# Patient Record
Sex: Male | Born: 1975 | Race: White | Hispanic: No | Marital: Single | Smoking: Current every day smoker
Health system: Southern US, Community
[De-identification: ages and names within clinical notes are randomized; demographics above are authoritative.]

## PROBLEM LIST (undated history)

## (undated) HISTORY — PX: CARPAL TUNNEL RELEASE: SHX101

## (undated) HISTORY — PX: HERNIA REPAIR: SHX51

## (undated) HISTORY — PX: TONSILLECTOMY: SUR1361

## (undated) HISTORY — PX: BACK SURGERY: SHX140

---

## 2006-07-17 ENCOUNTER — Ambulatory Visit (HOSPITAL_COMMUNITY): Admission: RE | Admit: 2006-07-17 | Discharge: 2006-07-17 | Payer: Self-pay | Admitting: Sports Medicine

## 2007-06-30 ENCOUNTER — Encounter: Admission: RE | Admit: 2007-06-30 | Discharge: 2007-06-30 | Payer: Self-pay | Admitting: Orthopedic Surgery

## 2007-10-12 ENCOUNTER — Encounter (INDEPENDENT_AMBULATORY_CARE_PROVIDER_SITE_OTHER): Payer: Self-pay | Admitting: Orthopedic Surgery

## 2007-10-12 ENCOUNTER — Inpatient Hospital Stay (HOSPITAL_COMMUNITY): Admission: RE | Admit: 2007-10-12 | Discharge: 2007-10-13 | Payer: Self-pay | Admitting: Orthopedic Surgery

## 2008-07-05 IMAGING — CR DG CHEST 1V PORT
1 series · 1 of 1 positions shown · non-contrast
Comparison: 10/09/06.

CLINICAL DATA: Central line placement.
 PORTABLE CHEST ? 1 VIEW:

[view not recorded]
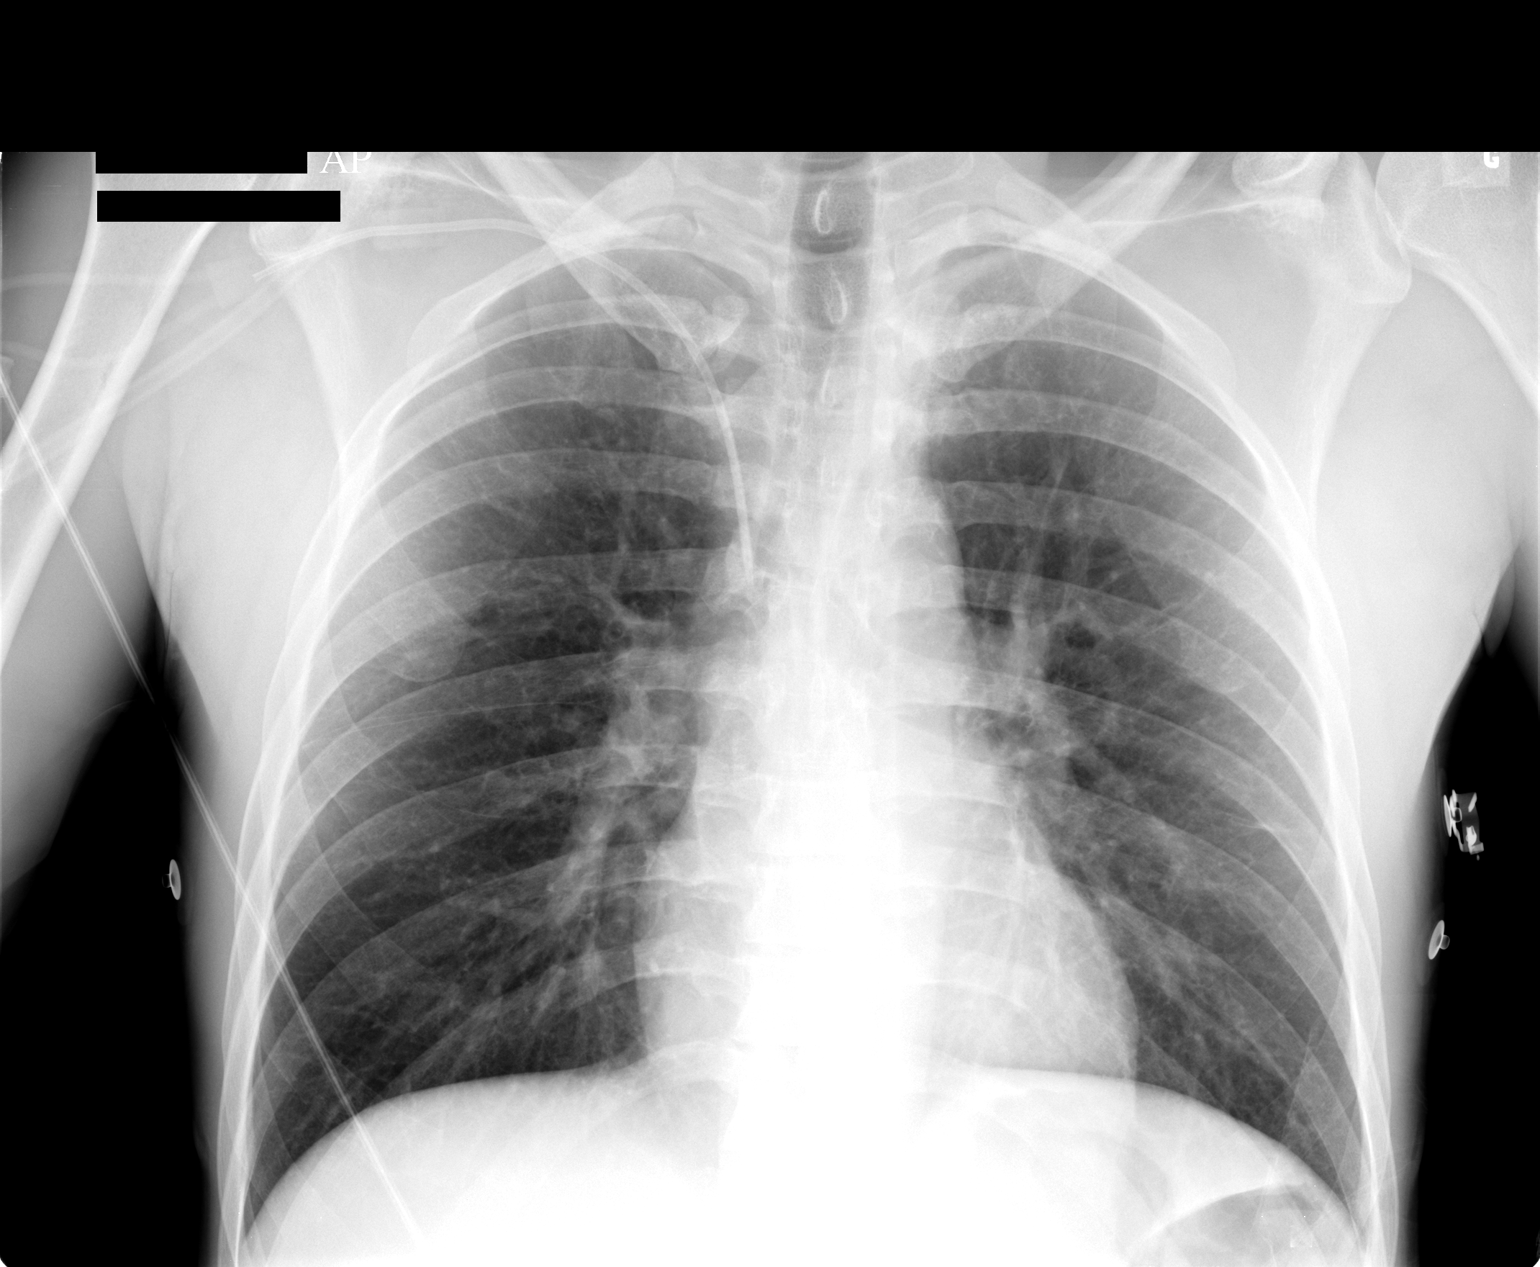

[1 of 1 positions shown; findings below may reference images not displayed]

FINDINGS: The patient has a right subclavian central venous catheter with tip in the superior vena cava.  No pneumothorax.  The lungs are clear.  Heart size is normal.  No effusion.  No focal bony abnormality.
IMPRESSION: Right subclavian catheter without complicating features.  No acute finding.

## 2011-02-03 NOTE — Op Note (Signed)
[name], [name]                 ACCOUNT NO.:  [id number]   MEDICAL RECORD NO.:  [id number]          PATIENT TYPE:  INP   LOCATION:  2550                         FACILITY:  [facility]   PHYSICIAN:  Nelda Severe, MD      DATE OF BIRTH:  01/12/1976   DATE OF PROCEDURE:  10/12/2007  DATE OF DISCHARGE:                               OPERATIVE REPORT   SURGEON:  Nelda Severe, M.D.   ASSISTANT:  Lianne Cure, P.A.-C.   PREOPERATIVE DIAGNOSIS:  Internal disc disruption L5-S1/disc  degeneration.   POSTOPERATIVE DIAGNOSIS:  Internal disc disruption L5-S1/disc  degeneration.   OPERATIVE PROCEDURE:  Anterior interbody fusion L5 to S1 with Synfix  cage/internal fixation plate; autogenous bone graft left anterior iliac  crest.   OPERATIVE NOTE:  The patient was placed under general endotracheal  anesthesia.  A Foley catheter was placed in the bladder.  Sequential  compression devices were placed on both lower extremities.  A pulse  oximeter was placed on the left great toe.  The patient had received  intravenous prophylactic antibiotics.  He was positioned supine on a  radiolucent Jackson table with the arms abducted at the sides.  The hair  was clipped in the suprapubic area.  The abdomen was prepped with  DuraPrep and draped in a square fashion so as to expose the left  anterior iliac crest, as well.  The drapes were secured with Ioban.   A short incision was made just distal to the lateral aspect of the left  anterior iliac crest, about 2-3 inches posterior to the anterior  superior iliac spine.  The periosteum over the iliac crest was injected  with 0.25% Marcaine with epinephrine.  Dissection was carried down onto  the external oblique fascia and the external oblique retracted somewhat  medially.  The fascial insertion was then incised using cutting current  and subperiosteal dissection of the top of the iliac crest performed.  The retractor was placed.  The iliac crest on its  superior surface was  fenestrated with a small gouge to create a hole into the medullary  cavity.  This allowed a curette to be used to scoop out a satisfactory  quantity of cancellous graft.  When enough graft had been obtained, a  small piece Gelfoam was placed into the cavity.   Next, an oblique incision was made in the left lower quadrant in the  left paramedian area at such a level as to be able to approach the L5-S1  disc.  The rectus sheath was cut in line with the incision.  The medial  edge of the rectus was mobilized and retracted laterally.  The arcuate  line was identified.  The fat was bluntly dissected with a sponge on a  stick into the retroperitoneal area.  The peritoneal sac was mobilized  to the right side along with the ureter which was adherent to the  posterior aspect of the peritoneal sac.  The common iliac vein could be  identified as could be the sacral promontory.  2-3 medium veins were  bipolar coagulated after blunt dissection of  the overlying tissue from  right to left.  Bilateral Braugh retractors were placed to retract the  iliac vessels right and left.  We then placed another Braugh blade in  the midline proximally retracting proximally and one distally retracting  distally.  A small screw was placed in the annulus and an AP and lateral  fluoroscopic view taken.  It approximated the midline on the AP view and  was confirmed to be at the L5-S1 level on the lateral.   We then performed a rectangular annulotomy of the L5-S1 disc and used a  special disc elevator to separate disc material from the endplate above  and below.  Peripherally, the disc was detached with curettes and most  the disc removed in one fragment.  I then used curettes to curette  posteriorly back to the outer fibers of the annulus.  Care was taken to  curette away endplate cartilage from the endplates.   The disc was sized with a variety of sizes.  Ultimately, we chose a  small endplate  12 degrees implant.  This was filled with graft and the  graft inserted with the Squid device.  We then checked its position with  AP and lateral fluoroscopic views.  It needed to be countersunk 2-3 mm  which was then done.  Screws were then used to attach it to the  endplates above and below.  Fluoroscopic views, AP and lateral, were  then taken and recorded.   The retractors were then removed sequentially and the ureter checked to  make sure it was intact.  There was no bleeding as the retractors were  removed.  The rectus sheath was closed using continuous #1 Vicryl  suture.  The subcutaneous layer was closed using continuous 2-0 Vicryl  suture and the skin was closed using subcuticular 3-0 undyed Vicryl in  continuous fashion.  The bone graft harvest wound was closed using #1  Vicryl in the fascia, 2-0 Vicryl in the subcutaneous layer, and 3-0  undyed Vicryl in the skin.  Both skin incisions were reinforced with  Dermabond and a gauze dressing applied and secured with OpSite.   The blood loss was estimated at 50 mL.  Subsequent to closure, we waited  for radiographs to be taken to check for the presence of any retained  instruments and none were observed.  The patient returned to the  recovery room awake and was in satisfactory condition.  He has intact  dorsiflexion and plantar flexion of both feet, ankles and toes, and  easily palpable dorsalis pedis pulses bilaterally.      Nelda Severe, MD  Electronically Signed     MT/MEDQ  D:  10/12/2007  T:  10/12/2007  Job:  [id number]

## 2011-02-06 NOTE — Discharge Summary (Signed)
[name], [name]                 ACCOUNT NO.:  [id number]   MEDICAL RECORD NO.:  [id number]          PATIENT TYPE:  INP   LOCATION:  5018                         FACILITY:  [facility]   PHYSICIAN:  Nelda Severe, MD      DATE OF BIRTH:  1976/02/11   DATE OF ADMISSION:  10/12/2007  DATE OF DISCHARGE:  10/13/2007                               DISCHARGE SUMMARY   PREOPERATIVE DIAGNOSIS:  L5-S1 disk central.   PROCEDURE PLANNED:  Anterior interbody fusion with bone graft at L5-S1.   Postoperative minimal blood loss at 50 ml.  Active range of motion.  DP  pulses were intact postoperatively.  He was admitted to [facility] for observation.  Postoperative day one, hemoglobin was stable  at 12.4, platelets 97, white count 10.8, B-MET within normal limits.  He  was afebrile.  Incision clean and dry.  No dressing necessary.  We used  Dermabond.  We discontinued the PCA, the central line.  He was taking  p.o. well, moving independently about the room.  He was discharged home.   DISCHARGE DIAGNOSIS:  L5-S1 disk herniation, status post anterior  interbody fusion with bone graft.   DIET:  Regular.   DISPOSITION:  Stable.   He may shower.  We want no bending, stooping, lifting.  We sent home  with prescription for Norco 10/325, he can take 1-2 every 4 hours as  needed for pain.  Suggest over-the-counter stool softener as needed.  We  placed him on Coumadin 5 mg p.o. daily.  He is going to have PT INR  draws biweekly to be dosed per pharmacy to maintain INR of 2-3.  He is  going to follow up in our office in approximately 4 weeks.  He is to  call for an appointment.      Lianne Cure, P.A.      Nelda Severe, MD  Electronically Signed    MC/MEDQ  D:  10/21/2007  T:  10/21/2007  Job:  [id number]

## 2011-06-11 LAB — COMPREHENSIVE METABOLIC PANEL
ALT: 13
Alkaline Phosphatase: 62
BUN: 8
CO2: 30
Calcium: 9.6
GFR calc non Af Amer: >60
Glucose, Bld: 81
Potassium: 4.6
Sodium: 138
Total Protein: 7.1

## 2011-06-11 LAB — APTT: aPTT: 30

## 2011-06-11 LAB — URINALYSIS, ROUTINE W REFLEX MICROSCOPIC
Bilirubin Urine: NEGATIVE
Hgb urine dipstick: NEGATIVE
Ketones, ur: NEGATIVE
Nitrite: NEGATIVE
Protein, ur: NEGATIVE
Specific Gravity, Urine: 1.011
Urobilinogen, UA: 0.2

## 2011-06-11 LAB — PROTIME-INR
INR: 0.8
Prothrombin Time: 11.7
Prothrombin Time: 12.7

## 2011-06-11 LAB — DIFFERENTIAL
Basophils Relative: 1
Eosinophils Absolute: 0.1
Monocytes Relative: 13 — ABNORMAL HIGH
Neutro Abs: 2.1
Neutrophils Relative %: 53

## 2011-06-11 LAB — CBC
HCT: 36.4 — ABNORMAL LOW
HCT: 42.9
Hemoglobin: 12.4 — ABNORMAL LOW
Hemoglobin: 14.7
MCHC: 34
MCHC: 34.2
MCV: 88.5
RBC: 4.11 — ABNORMAL LOW
RBC: 4.88
RDW: 13.6
WBC: 10.8 — ABNORMAL HIGH

## 2011-06-11 LAB — BASIC METABOLIC PANEL
CO2: 30
Chloride: 105
Creatinine, Ser: 0.98
GFR calc Af Amer: >60
Potassium: 4.1
Sodium: 139

## 2011-06-11 LAB — ABO/RH: ABO/RH(D): B POS

## 2022-05-22 DIAGNOSIS — S2249XA Multiple fractures of ribs, unspecified side, initial encounter for closed fracture: Secondary | ICD-10-CM

## 2022-05-22 HISTORY — DX: Multiple fractures of ribs, unspecified side, initial encounter for closed fracture: S22.49XA

## 2023-03-20 ENCOUNTER — Emergency Department (HOSPITAL_COMMUNITY): Payer: 59

## 2023-03-20 ENCOUNTER — Emergency Department (HOSPITAL_COMMUNITY)
Admission: EM | Admit: 2023-03-20 | Discharge: 2023-03-20 | Disposition: A | Discharge status: Home / Self Care | Payer: 59 | Attending: Emergency Medicine | Admitting: Emergency Medicine

## 2023-03-20 ENCOUNTER — Other Ambulatory Visit: Payer: Self-pay

## 2023-03-20 ENCOUNTER — Encounter (HOSPITAL_COMMUNITY): Payer: Self-pay

## 2023-03-20 DIAGNOSIS — Y9389 Activity, other specified: Secondary | ICD-10-CM | POA: Insufficient documentation

## 2023-03-20 DIAGNOSIS — S199XXA Unspecified injury of neck, initial encounter: Secondary | ICD-10-CM | POA: Diagnosis not present

## 2023-03-20 DIAGNOSIS — R569 Unspecified convulsions: Secondary | ICD-10-CM | POA: Diagnosis not present

## 2023-03-20 DIAGNOSIS — Y92007 Garden or yard of unspecified non-institutional (private) residence as the place of occurrence of the external cause: Secondary | ICD-10-CM | POA: Diagnosis not present

## 2023-03-20 DIAGNOSIS — R0682 Tachypnea, not elsewhere classified: Secondary | ICD-10-CM | POA: Diagnosis not present

## 2023-03-20 DIAGNOSIS — S42021A Displaced fracture of shaft of right clavicle, initial encounter for closed fracture: Secondary | ICD-10-CM | POA: Insufficient documentation

## 2023-03-20 DIAGNOSIS — S4991XA Unspecified injury of right shoulder and upper arm, initial encounter: Secondary | ICD-10-CM | POA: Diagnosis not present

## 2023-03-20 DIAGNOSIS — S42001A Fracture of unspecified part of right clavicle, initial encounter for closed fracture: Secondary | ICD-10-CM | POA: Diagnosis not present

## 2023-03-20 DIAGNOSIS — S0990XA Unspecified injury of head, initial encounter: Secondary | ICD-10-CM | POA: Insufficient documentation

## 2023-03-20 DIAGNOSIS — R55 Syncope and collapse: Secondary | ICD-10-CM | POA: Insufficient documentation

## 2023-03-20 DIAGNOSIS — M7989 Other specified soft tissue disorders: Secondary | ICD-10-CM | POA: Diagnosis not present

## 2023-03-20 DIAGNOSIS — R0689 Other abnormalities of breathing: Secondary | ICD-10-CM | POA: Diagnosis not present

## 2023-03-20 DIAGNOSIS — W19XXXA Unspecified fall, initial encounter: Secondary | ICD-10-CM | POA: Diagnosis not present

## 2023-03-20 DIAGNOSIS — M25519 Pain in unspecified shoulder: Secondary | ICD-10-CM | POA: Diagnosis not present

## 2023-03-20 LAB — CBC WITH DIFFERENTIAL/PLATELET
Abs Immature Granulocytes: 0.04 10*3/uL (ref 0.00–0.07)
Basophils Absolute: 0.1 10*3/uL (ref 0.0–0.1)
Basophils Relative: 1 %
Eosinophils Absolute: 0.1 10*3/uL (ref 0.0–0.5)
Eosinophils Relative: 1 %
HCT: 39.8 % (ref 39.0–52.0)
Hemoglobin: 13.5 g/dL (ref 13.0–17.0)
Immature Granulocytes: 0 %
Lymphocytes Relative: 10 %
Lymphs Abs: 1.4 10*3/uL (ref 0.7–4.0)
MCH: 29.9 pg (ref 26.0–34.0)
MCHC: 33.9 g/dL (ref 30.0–36.0)
MCV: 88.1 fL (ref 80.0–100.0)
Monocytes Absolute: 1.1 10*3/uL — ABNORMAL HIGH (ref 0.1–1.0)
Monocytes Relative: 8 %
Neutro Abs: 11 10*3/uL — ABNORMAL HIGH (ref 1.7–7.7)
Neutrophils Relative %: 80 %
Platelets: 114 10*3/uL — ABNORMAL LOW (ref 150–400)
RBC: 4.52 MIL/uL (ref 4.22–5.81)
RDW: 13.1 % (ref 11.5–15.5)
WBC: 13.7 10*3/uL — ABNORMAL HIGH (ref 4.0–10.5)
nRBC: 0 % (ref 0.0–0.2)

## 2023-03-20 LAB — ETHANOL: Alcohol, Ethyl (B): <10 mg/dL (ref <10)

## 2023-03-20 MED ORDER — HYDROMORPHONE HCL 1 MG/ML IJ SOLN
1.0000 mg | Freq: Once | INTRAMUSCULAR | Status: AC
  Administered 2023-03-20: 1 mg via INTRAVENOUS
  Filled 2023-03-20: qty 1

## 2023-03-20 MED ORDER — OXYCODONE-ACETAMINOPHEN 5-325 MG PO TABS
1.0000 | ORAL_TABLET | Freq: Once | ORAL | Status: AC
  Administered 2023-03-20: 1 via ORAL
  Filled 2023-03-20: qty 1

## 2023-03-20 MED ORDER — OXYCODONE-ACETAMINOPHEN 5-325 MG PO TABS: 1.0000 | ORAL_TABLET | Freq: Four times a day (QID) | ORAL | 0 refills | Status: DC | PRN

## 2023-03-20 NOTE — Progress Notes (Signed)
Orthopedic Tech Progress Note Patient Details:  [name] August 28, 1976 [id number]  Ortho Devices Type of Ortho Device: Sling immobilizer Ortho Device/Splint Location: rue Ortho Device/Splint Interventions: Ordered, Application, Adjustment  I applied the sling immobilizer and taught the patient and family how to apply it. Post Interventions Patient Tolerated: Well Instructions Provided: Care of device  [name] 03/20/2023, 11:14 PM
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
## 2023-03-20 NOTE — ED Triage Notes (Signed)
Given total of fentanyl by EMS

## 2023-03-23 ENCOUNTER — Ambulatory Visit (HOSPITAL_BASED_OUTPATIENT_CLINIC_OR_DEPARTMENT_OTHER): Payer: 59 | Admitting: Student

## 2023-03-31 ENCOUNTER — Ambulatory Visit (INDEPENDENT_AMBULATORY_CARE_PROVIDER_SITE_OTHER): Payer: 59

## 2023-03-31 ENCOUNTER — Ambulatory Visit (INDEPENDENT_AMBULATORY_CARE_PROVIDER_SITE_OTHER): Payer: 59 | Admitting: Student

## 2023-03-31 ENCOUNTER — Encounter (HOSPITAL_BASED_OUTPATIENT_CLINIC_OR_DEPARTMENT_OTHER): Payer: Self-pay | Admitting: Student

## 2023-03-31 DIAGNOSIS — S42021A Displaced fracture of shaft of right clavicle, initial encounter for closed fracture: Secondary | ICD-10-CM
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
## 2023-04-05 ENCOUNTER — Ambulatory Visit (HOSPITAL_BASED_OUTPATIENT_CLINIC_OR_DEPARTMENT_OTHER): Payer: Self-pay | Admitting: Orthopaedic Surgery

## 2023-04-05 DIAGNOSIS — S42021A Displaced fracture of shaft of right clavicle, initial encounter for closed fracture: Secondary | ICD-10-CM

## 2023-04-05 NOTE — Progress Notes (Signed)
[name] did not answer my calls or call me back.  I called and left instructions.  Nothing to eat after midnight, May have H2O , soda, Gatorade, tea,until 0700.  I instructed patient to not take any medications.  I gave shower instructions. I left the Pre- Op desk number and asked him to call for any ?'s, problems. If running late, if he or anyone in the home has s/s of Covid, if he has been around anyone with Covid in the past 10 days or if he develops s/s of respiratory infection.

## 2023-04-06 ENCOUNTER — Other Ambulatory Visit: Payer: Self-pay

## 2023-04-06 ENCOUNTER — Ambulatory Visit (HOSPITAL_BASED_OUTPATIENT_CLINIC_OR_DEPARTMENT_OTHER): Payer: 59 | Admitting: Anesthesiology

## 2023-04-06 ENCOUNTER — Ambulatory Visit (HOSPITAL_COMMUNITY)
Admission: RE | Admit: 2023-04-06 | Discharge: 2023-04-06 | Disposition: A | Discharge status: Home / Self Care | Payer: 59 | Attending: Orthopaedic Surgery | Admitting: Orthopaedic Surgery

## 2023-04-06 ENCOUNTER — Encounter (HOSPITAL_COMMUNITY)
Admission: RE | Disposition: A | Discharge status: Home / Self Care | Payer: Self-pay | Source: Home / Self Care | Attending: Orthopaedic Surgery

## 2023-04-06 ENCOUNTER — Ambulatory Visit (HOSPITAL_COMMUNITY): Payer: 59 | Admitting: Anesthesiology

## 2023-04-06 ENCOUNTER — Ambulatory Visit (HOSPITAL_COMMUNITY): Payer: 59

## 2023-04-06 ENCOUNTER — Encounter (HOSPITAL_COMMUNITY): Payer: Self-pay | Admitting: Orthopaedic Surgery

## 2023-04-06 DIAGNOSIS — F17210 Nicotine dependence, cigarettes, uncomplicated: Secondary | ICD-10-CM | POA: Diagnosis not present

## 2023-04-06 DIAGNOSIS — S42021A Displaced fracture of shaft of right clavicle, initial encounter for closed fracture: Secondary | ICD-10-CM

## 2023-04-06 DIAGNOSIS — Y9355 Activity, bike riding: Secondary | ICD-10-CM | POA: Insufficient documentation

## 2023-04-06 DIAGNOSIS — S42021D Displaced fracture of shaft of right clavicle, subsequent encounter for fracture with routine healing: Secondary | ICD-10-CM | POA: Diagnosis not present

## 2023-04-06 HISTORY — PX: ORIF CLAVICULAR FRACTURE: SHX5055

## 2023-04-06 SURGERY — OPEN REDUCTION INTERNAL FIXATION (ORIF) CLAVICULAR FRACTURE
Anesthesia: General | Laterality: Right

## 2023-04-06 MED ORDER — FENTANYL CITRATE (PF) 250 MCG/5ML IJ SOLN
INTRAMUSCULAR | Status: DC | PRN
  Administered 2023-04-06: 150 ug via INTRAVENOUS

## 2023-04-06 MED ORDER — DEXAMETHASONE SODIUM PHOSPHATE 10 MG/ML IJ SOLN
INTRAMUSCULAR | Status: DC | PRN
  Administered 2023-04-06: 10 mg via INTRAVENOUS

## 2023-04-06 MED ORDER — VANCOMYCIN HCL 1000 MG IV SOLR
INTRAVENOUS | Status: AC
  Filled 2023-04-06: qty 20

## 2023-04-06 MED ORDER — CEFAZOLIN SODIUM-DEXTROSE 2-4 GM/100ML-% IV SOLN
2.0000 g | INTRAVENOUS | Status: AC
  Administered 2023-04-06: 2 g via INTRAVENOUS

## 2023-04-06 MED ORDER — ORAL CARE MOUTH RINSE: 15.0000 mL | Freq: Once | OROMUCOSAL | Status: AC

## 2023-04-06 MED ORDER — IBUPROFEN 800 MG PO TABS: 800.0000 mg | ORAL_TABLET | Freq: Three times a day (TID) | ORAL | 0 refills | Status: AC

## 2023-04-06 MED ORDER — ASPIRIN 325 MG PO TBEC: 325.0000 mg | DELAYED_RELEASE_TABLET | Freq: Every day | ORAL | 0 refills | Status: AC

## 2023-04-06 MED ORDER — CEFAZOLIN SODIUM-DEXTROSE 2-4 GM/100ML-% IV SOLN
INTRAVENOUS | Status: AC
  Filled 2023-04-06: qty 100

## 2023-04-06 MED ORDER — AMISULPRIDE (ANTIEMETIC) 5 MG/2ML IV SOLN: 10.0000 mg | Freq: Once | INTRAVENOUS | Status: DC | PRN

## 2023-04-06 MED ORDER — TRANEXAMIC ACID-NACL 1000-0.7 MG/100ML-% IV SOLN
1000.0000 mg | INTRAVENOUS | Status: AC
  Administered 2023-04-06: 1000 mg via INTRAVENOUS

## 2023-04-06 MED ORDER — LACTATED RINGERS IV SOLN: INTRAVENOUS | Status: DC

## 2023-04-06 MED ORDER — ACETAMINOPHEN 500 MG PO TABS: 500.0000 mg | ORAL_TABLET | Freq: Three times a day (TID) | ORAL | 0 refills | Status: AC

## 2023-04-06 MED ORDER — CHLORHEXIDINE GLUCONATE 0.12 % MT SOLN
OROMUCOSAL | Status: AC
  Administered 2023-04-06: 15 mL via OROMUCOSAL
  Filled 2023-04-06: qty 15

## 2023-04-06 MED ORDER — PROPOFOL 10 MG/ML IV BOLUS
INTRAVENOUS | Status: DC | PRN
  Administered 2023-04-06: 150 mg via INTRAVENOUS

## 2023-04-06 MED ORDER — TRANEXAMIC ACID-NACL 1000-0.7 MG/100ML-% IV SOLN
INTRAVENOUS | Status: AC
  Filled 2023-04-06: qty 100

## 2023-04-06 MED ORDER — BUPIVACAINE-EPINEPHRINE 0.5% -1:200000 IJ SOLN
INTRAMUSCULAR | Status: DC | PRN
  Administered 2023-04-06: 20 mL

## 2023-04-06 MED ORDER — SUGAMMADEX SODIUM 200 MG/2ML IV SOLN
INTRAVENOUS | Status: DC | PRN
  Administered 2023-04-06: 131.6 mg via INTRAVENOUS

## 2023-04-06 MED ORDER — CHLORHEXIDINE GLUCONATE 0.12 % MT SOLN: 15.0000 mL | Freq: Once | OROMUCOSAL | Status: AC

## 2023-04-06 MED ORDER — FENTANYL CITRATE (PF) 250 MCG/5ML IJ SOLN
INTRAMUSCULAR | Status: AC
  Filled 2023-04-06: qty 5

## 2023-04-06 MED ORDER — PHENYLEPHRINE HCL-NACL 20-0.9 MG/250ML-% IV SOLN
INTRAVENOUS | Status: DC | PRN
  Administered 2023-04-06: 25 ug/min via INTRAVENOUS

## 2023-04-06 MED ORDER — LIDOCAINE 2% (20 MG/ML) 5 ML SYRINGE
INTRAMUSCULAR | Status: DC | PRN
  Administered 2023-04-06: 60 mg via INTRAVENOUS

## 2023-04-06 MED ORDER — MEPERIDINE HCL 25 MG/ML IJ SOLN: 6.2500 mg | INTRAMUSCULAR | Status: DC | PRN

## 2023-04-06 MED ORDER — PROMETHAZINE HCL 25 MG/ML IJ SOLN: 6.2500 mg | INTRAMUSCULAR | Status: DC | PRN

## 2023-04-06 MED ORDER — OXYCODONE HCL 5 MG PO TABS: 5.0000 mg | ORAL_TABLET | Freq: Once | ORAL | Status: AC | PRN

## 2023-04-06 MED ORDER — HYDROMORPHONE HCL 1 MG/ML IJ SOLN
INTRAMUSCULAR | Status: AC
  Filled 2023-04-06: qty 1

## 2023-04-06 MED ORDER — VANCOMYCIN HCL 1000 MG IV SOLR
INTRAVENOUS | Status: DC | PRN
  Administered 2023-04-06: 1000 mg via TOPICAL

## 2023-04-06 MED ORDER — OXYCODONE HCL 5 MG/5ML PO SOLN
ORAL | Status: AC
  Filled 2023-04-06: qty 5

## 2023-04-06 MED ORDER — PROPOFOL 500 MG/50ML IV EMUL
INTRAVENOUS | Status: DC | PRN
  Administered 2023-04-06: 50 ug/kg/min via INTRAVENOUS

## 2023-04-06 MED ORDER — MIDAZOLAM HCL 2 MG/2ML IJ SOLN
INTRAMUSCULAR | Status: DC | PRN
  Administered 2023-04-06: 2 mg via INTRAVENOUS

## 2023-04-06 MED ORDER — GABAPENTIN 300 MG PO CAPS
ORAL_CAPSULE | ORAL | Status: AC
  Administered 2023-04-06: 300 mg via ORAL
  Filled 2023-04-06: qty 1

## 2023-04-06 MED ORDER — 0.9 % SODIUM CHLORIDE (POUR BTL) OPTIME
TOPICAL | Status: DC | PRN
  Administered 2023-04-06: 1000 mL

## 2023-04-06 MED ORDER — GABAPENTIN 300 MG PO CAPS: 300.0000 mg | ORAL_CAPSULE | Freq: Once | ORAL | Status: AC

## 2023-04-06 MED ORDER — BUPIVACAINE-EPINEPHRINE (PF) 0.5% -1:200000 IJ SOLN
INTRAMUSCULAR | Status: AC
  Filled 2023-04-06: qty 30

## 2023-04-06 MED ORDER — MIDAZOLAM HCL 2 MG/2ML IJ SOLN
INTRAMUSCULAR | Status: AC
  Filled 2023-04-06: qty 2

## 2023-04-06 MED ORDER — HYDROMORPHONE HCL 1 MG/ML IJ SOLN
0.2500 mg | INTRAMUSCULAR | Status: DC | PRN
  Administered 2023-04-06 (×2): 0.5 mg via INTRAVENOUS

## 2023-04-06 MED ORDER — OXYCODONE HCL 5 MG PO TABS: 5.0000 mg | ORAL_TABLET | ORAL | 0 refills | Status: AC | PRN

## 2023-04-06 MED ORDER — ONDANSETRON HCL 4 MG/2ML IJ SOLN
INTRAMUSCULAR | Status: DC | PRN
  Administered 2023-04-06: 4 mg via INTRAVENOUS

## 2023-04-06 MED ORDER — ACETAMINOPHEN 500 MG PO TABS
ORAL_TABLET | ORAL | Status: AC
  Administered 2023-04-06: 1000 mg via ORAL
  Filled 2023-04-06: qty 2

## 2023-04-06 MED ORDER — ROCURONIUM BROMIDE 10 MG/ML (PF) SYRINGE
PREFILLED_SYRINGE | INTRAVENOUS | Status: DC | PRN
  Administered 2023-04-06: 70 mg via INTRAVENOUS

## 2023-04-06 MED ORDER — ACETAMINOPHEN 500 MG PO TABS: 1000.0000 mg | ORAL_TABLET | Freq: Once | ORAL | Status: AC

## 2023-04-06 MED ORDER — OXYCODONE HCL 5 MG/5ML PO SOLN
5.0000 mg | Freq: Once | ORAL | Status: AC | PRN
  Administered 2023-04-06: 5 mg via ORAL

## 2023-04-06 SURGICAL SUPPLY — 53 items
BAG COUNTER SPONGE SURGICOUNT (BAG) ×2 IMPLANT
BAG SPNG CNTER NS LX DISP (BAG) ×1
BIT DRILL CLAV ALPS 2.7X145 (BIT) IMPLANT
BIT DRILL QC 110 3.5 (BIT) ×1
BIT DRILL QC 110 3.5MM (BIT) IMPLANT
BNDG COHESIVE 4X5 TAN STRL (GAUZE/BANDAGES/DRESSINGS) IMPLANT
BRUSH SCRUB EZ PLAIN DRY (MISCELLANEOUS) ×4 IMPLANT
COVER SURGICAL LIGHT HANDLE (MISCELLANEOUS) ×4 IMPLANT
DRAPE C-ARM 42X72 X-RAY (DRAPES) ×2 IMPLANT
DRAPE INCISE IOBAN 66X45 STRL (DRAPES) ×2 IMPLANT
DRAPE LAPAROTOMY TRNSV 102X78 (DRAPES) ×2 IMPLANT
DRAPE ORTHO SPLIT 77X108 STRL (DRAPES) ×1
DRAPE SURG ORHT 6 SPLT 77X108 (DRAPES) IMPLANT
DRAPE U-SHAPE 47X51 STRL (DRAPES) ×4 IMPLANT
DRILL BIT QC 110 3.5MM (BIT) ×1
DRSG MEPILEX POST OP 4X8 (GAUZE/BANDAGES/DRESSINGS) ×2 IMPLANT
ELECT REM PT RETURN 9FT ADLT (ELECTROSURGICAL) ×1
ELECTRODE REM PT RTRN 9FT ADLT (ELECTROSURGICAL) ×2 IMPLANT
GLOVE BIO SURGEON STRL SZ7.5 (GLOVE) ×2 IMPLANT
GLOVE BIO SURGEON STRL SZ8 (GLOVE) ×2 IMPLANT
GLOVE BIOGEL PI IND STRL 7.5 (GLOVE) ×2 IMPLANT
GLOVE BIOGEL PI IND STRL 8 (GLOVE) ×2 IMPLANT
GLOVE SURG ORTHO LTX SZ7.5 (GLOVE) ×4 IMPLANT
GOWN STRL REUS W/ TWL LRG LVL3 (GOWN DISPOSABLE) ×4 IMPLANT
GOWN STRL REUS W/ TWL XL LVL3 (GOWN DISPOSABLE) ×2 IMPLANT
GOWN STRL REUS W/TWL LRG LVL3 (GOWN DISPOSABLE) ×2
GOWN STRL REUS W/TWL XL LVL3 (GOWN DISPOSABLE) ×1
K-WIRE TROCHAR TIP ALPS 1.6 (WIRE) ×1
KIT BASIN OR (CUSTOM PROCEDURE TRAY) ×2 IMPLANT
KIT TURNOVER KIT B (KITS) ×2 IMPLANT
KWIRE TROCHAR TIP ALPS 1.6 (WIRE) IMPLANT
MANIFOLD NEPTUNE II (INSTRUMENTS) ×2 IMPLANT
NS IRRIG 1000ML POUR BTL (IV SOLUTION) ×2 IMPLANT
PACK GENERAL/GYN (CUSTOM PROCEDURE TRAY) ×2 IMPLANT
PAD ARMBOARD 7.5X6 YLW CONV (MISCELLANEOUS) ×4 IMPLANT
PLATE CLAV SUP RT 8H 900 NS (Plate) IMPLANT
SCREW CORT LP 3.5X12 (Screw) IMPLANT
SCREW CORT LP T15 3.5X16 (Screw) IMPLANT
SCREW LP NL T15 3.5X20 (Screw) IMPLANT
SCREW T15 LP CORT 3.5X10MM (Screw) IMPLANT
SCREW TIS LP 3.5X18 NS (Screw) IMPLANT
STAPLER VISISTAT 35W (STAPLE) ×2 IMPLANT
STRIP CLOSURE SKIN 1/2X4 (GAUZE/BANDAGES/DRESSINGS) ×2 IMPLANT
SUCTION TUBE FRAZIER 10FR DISP (SUCTIONS) ×2 IMPLANT
SUT MNCRL AB 3-0 PS2 27 (SUTURE) ×2 IMPLANT
SUT VIC AB 0 CT1 27 (SUTURE) ×1
SUT VIC AB 0 CT1 27XBRD ANBCTR (SUTURE) ×2 IMPLANT
SUT VIC AB 2-0 CT1 27 (SUTURE) ×1
SUT VIC AB 2-0 CT1 TAPERPNT 27 (SUTURE) ×2 IMPLANT
SYR CONTROL 10ML LL (SYRINGE) IMPLANT
TOWEL GREEN STERILE (TOWEL DISPOSABLE) ×2 IMPLANT
TOWEL GREEN STERILE FF (TOWEL DISPOSABLE) ×4 IMPLANT
WATER STERILE IRR 1000ML POUR (IV SOLUTION) ×2 IMPLANT

## 2023-04-06 NOTE — Op Note (Signed)
Date of Surgery: 04/06/2023  INDICATIONS: [name] is a 47 y.o.-year-old male with a right displaced clavicle fracture.  He ultimately elected for open reduction internal fixation given his displacement profile.  The risk and benefits of the procedure were discussed in detail and documented in the pre-operative evaluation.   PREOPERATIVE DIAGNOSIS: 1.  Displaced right midshaft clavicle fracture  POSTOPERATIVE DIAGNOSIS: Same.  PROCEDURE: 1.  Open reduction internal fixation right clavicle fracture  SURGEON: James Deeds MD  ASSISTANT: Kerby Less, ATC  ANESTHESIA:  general +10 cc 0.25% Marcaine  IV FLUIDS AND URINE: See anesthesia record.  ANTIBIOTICS: Ancef  ESTIMATED BLOOD LOSS: 20 mL.  IMPLANTS:  Implant Name Type Inv. Item Serial No. Manufacturer Lot No. LRB No. Used Action  PLATE CLAV SUP RT 8H 900 NS - [id number] Plate PLATE CLAV SUP RT 8H 900 NS  [employer](ORTH,TRAU,BIO,SG)  Right 1 Implanted  SCREW T15 LP CORT 3.5X10MM - [id number] Screw SCREW T15 LP CORT 3.5X10MM  [employer](ORTH,TRAU,BIO,SG)  Right 1 Implanted  SCREW CORT LP 3.5X12 - [id number] Screw SCREW CORT LP 3.5X12  [employer](ORTH,TRAU,BIO,SG)  Right 1 Implanted  SCREW CORT LP T15 3.5X16 - [id number] Screw SCREW CORT LP T15 3.5X16  [employer](ORTH,TRAU,BIO,SG)  Right 7 Implanted  SCREW LP NL T15 3.5X20 - [id number] Screw SCREW LP NL T15 3.5X20  [employer]IMMER [employer]ON(ORTH,TRAU,BIO,SG)  Right 2 Implanted  SCREW TIS LP 3.5X18 NS - [id number] Screw SCREW TIS LP 3.5X18 NS  [employer](ORTH,TRAU,BIO,SG)  Right 1 Implanted    DRAINS: None  CULTURES: None  COMPLICATIONS: none  DESCRIPTION OF PROCEDURE:  I identified the patient in the pre-operative holding area.  I marked the operative shoulder with my initials. I reviewed the risks and benefits of the proposed surgical intervention and the patient (and/or patient's guardian) wished to proceed.  Anesthesia was then performed with regional block.  The patient  was transferred to the operative suite and placed in the lazy beach chair position with all bony prominences padded.  The patient was provided general anesthesia.   SCDs were placed on the bilateral lower extremity. Appropriate antibiotics was administered within 1 hour before incision. The operative extremity was then prepped and draped in standard fashion. A time out was performed confirming the correct extremity, correct patient and correct procedure.    A 6cm incision was made over the anterior border of the clavicle and carefully taken with full thickness flaps to the clavicle. The fracture was identified. Hematoma was removed and the bone ends were cleared of debris to facilitate reduction. Two lobster claws were used to reduce the fragments and a pointed reduction clamp used to hold provisional fixation. Appropriate reduction was confirmed with direct visualization and fluoroscopy. A 8 hole Zimmer clavicle plate was placed over the superior border of clavicle. Appropriate size and position was confirmed with fluoroscopy. Non-locking screws were placed medial and lateral of the fracture site. Throughout, care was taken to protect from inadvertent drill with protection by retractors. The butterfly fragments were approximated to their location with suture while leaving their soft tissue attachments in place to facilitate healing with bridging technique. Fluoroscopy and direct visualization confirmed final reduction and hardware position. The wound was copiously irrigated. The wound was closed in layers with 0-vicryl, 2-0 vicryl, and 3-0 nylon.    Instrument, sponge, and needle counts were correct prior to wound closure and at the conclusion of  the case.   The wound was dressed with xeroform, 4x8s, and tegaderm. The arm was placed  into a sling.   The patient awoke from anesthesia without difficulty and was transferred to the PACU in stable     POSTOPERATIVE PLAN: He may begin active range of motion  as tolerated.  He will refrain from lifting any weight heavier than 5 pounds.  He will be placed on 2 weeks of aspirin for DVT prophylaxis  James Deeds, MD 12:08 PM

## 2023-04-06 NOTE — Brief Op Note (Signed)
   Brief Op Note  Date of Surgery: 04/06/2023  Preoperative Diagnosis: RIGHT DISPLACED CLAVICLE FRACTURE  Postoperative Diagnosis: same  Procedure: Procedure(s): RIGHT OPEN REDUCTION INTERNAL FIXATION (ORIF) CLAVICULAR FRACTURE  Implants: Implant Name Type Inv. Item Serial No. Manufacturer Lot No. LRB No. Used Action  PLATE CLAV SUP RT 8H 900 NS - [id number] Plate PLATE CLAV SUP RT 8H 900 NS  [employer](ORTH,TRAU,BIO,SG)  Right 1 Implanted  SCREW T15 LP CORT 3.5X10MM - [id number] Screw SCREW T15 LP CORT 3.5X10MM  [employer](ORTH,TRAU,BIO,SG)  Right 1 Implanted  SCREW CORT LP 3.5X12 - [id number] Screw SCREW CORT LP 3.5X12  [employer](ORTH,TRAU,BIO,SG)  Right 1 Implanted  SCREW CORT LP T15 3.5X16 - [id number] Screw SCREW CORT LP T15 3.5X16  [employer](ORTH,TRAU,BIO,SG)  Right 7 Implanted  SCREW LP NL T15 3.5X20 - [id number] Screw SCREW LP NL T15 3.5X20  [employer](ORTH,TRAU,BIO,SG)  Right 2 Implanted  SCREW TIS LP 3.5X18 NS - [id number] Screw SCREW TIS LP 3.5X18 NS  [employer](ORTH,TRAU,BIO,SG)  Right 1 Implanted    Surgeons: Surgeon(s): Huel Cote, MD  Anesthesia: General    Estimated Blood Loss: See anesthesia record  Complications: None  Condition to PACU: Stable  Benancio Deeds, MD 04/06/2023 12:08 PM

## 2023-04-06 NOTE — Anesthesia Preprocedure Evaluation (Signed)
Anesthesia Evaluation  Patient identified by MRN, date of birth, ID band Patient awake    Reviewed: Allergy & Precautions, H&P , NPO status , Patient's Chart, lab work & pertinent test results  Airway Mallampati: II  TM Distance: >3 FB Neck ROM: Full    Dental no notable dental hx.    Pulmonary neg pulmonary ROS, Current Smoker and Patient abstained from smoking.   Pulmonary exam normal breath sounds clear to auscultation       Cardiovascular negative cardio ROS Normal cardiovascular exam Rhythm:Regular Rate:Normal     Neuro/Psych negative neurological ROS  negative psych ROS   GI/Hepatic negative GI ROS, Neg liver ROS,,,  Endo/Other  negative endocrine ROS    Renal/GU negative Renal ROS  negative genitourinary   Musculoskeletal negative musculoskeletal ROS (+)    Abdominal   Peds negative pediatric ROS (+)  Hematology negative hematology ROS (+)   Anesthesia Other Findings   Reproductive/Obstetrics negative OB ROS                             Anesthesia Physical Anesthesia Plan  ASA: 2  Anesthesia Plan: General   Post-op Pain Management: Dilaudid IV   Induction: Intravenous  PONV Risk Score and Plan: 1 and Ondansetron and Treatment may vary due to age or medical condition  Airway Management Planned: Oral ETT  Additional Equipment:   Intra-op Plan:   Post-operative Plan: Extubation in OR  Informed Consent: I have reviewed the patients History and Physical, chart, labs and discussed the procedure including the risks, benefits and alternatives for the proposed anesthesia with the patient or authorized representative who has indicated his/her understanding and acceptance.     Dental advisory given  Plan Discussed with: CRNA  Anesthesia Plan Comments:        Anesthesia Quick Evaluation

## 2023-04-06 NOTE — Discharge Instructions (Signed)
     Discharge Instructions    Attending Surgeon: Huel Cote, MD Office Phone Number: [phone]   Diagnosis and Procedures:    Surgeries Performed: Right clavicle open reduction internal fixation  Discharge Plan:    Diet: Resume usual diet. Begin with light or bland foods.  Drink plenty of fluids.  Activity:  Keep sling and dressing in place until your follow up visit in Physical Therapy You are advised to go home directly from the hospital or surgical center. Restrict your activities.  GENERAL INSTRUCTIONS: 1.  Keep your surgical site elevated above your heart for at least 5-7 days or longer to prevent swelling. This will improve your comfort and your overall recovery following surgery.     2. Please call Dr. Serena Croissant office at [phone] with questions Monday-Friday during business hours. If no one answers, please leave a message and someone should get back to the patient within 24 hours. For emergencies please call 911 or proceed to the emergency room.   3. Patient to notify surgical team if experiences any of the following: Bowel/Bladder dysfunction, uncontrolled pain, nerve/muscle weakness, incision with increased drainage or redness, nausea/vomiting and Fever greater than 101.0 F.  Be alert for signs of infection including redness, streaking, odor, fever or chills. Be alert for excessive pain or bleeding and notify your surgeon immediately.  WOUND INSTRUCTIONS:   Leave your dressing/cast/splint in place until your post operative visit.  Keep it clean and dry.  Always keep the incision clean and dry until the staples/sutures are removed. If there is no drainage from the incision you should keep it open to air. If there is drainage from the incision you must keep it covered at all times until the drainage stops  Do not soak in a bath tub, hot tub, pool, lake or other body of water until 21 days after your surgery and your incision is completely dry and healed.  If  you have removable sutures (or staples) they must be removed 10-14 days (unless otherwise instructed) from the day of your surgery.     1)  Elevate the extremity as much as possible.  2)  Keep the dressing clean and dry.  3)  Please call us if the dressing becomes wet or dirty.  4)  If you are experiencing worsening pain or worsening swelling, please call.     MEDICATIONS: Resume all previous home medications at the previous prescribed dose and frequency unless otherwise noted Start taking the  pain medications on an as-needed basis as prescribed  Please taper down pain medication over the next week following surgery.  Ideally you should not require a refill of any narcotic pain medication.  Take pain medication with food to minimize nausea. In addition to the prescribed pain medication, you may take over-the-counter pain relievers such as Tylenol.  Do NOT take additional tylenol if your pain medication already has tylenol in it.  Aspirin 325mg  daily for four weeks.      FOLLOWUP INSTRUCTIONS: 1. Follow up at the Physical Therapy Clinic 3-4 days following surgery. This appointment should be scheduled unless other arrangements have been made.The Physical Therapy scheduling number is [phone] if an appointment has not already been arranged.  2. Contact Dr. [phone] office during office hours at [phone] or the practice after hours line at [phone] for non-emergencies. For medical emergencies call 911.   Discharge Location: Home

## 2023-04-06 NOTE — Anesthesia Procedure Notes (Signed)
Procedure Name: Intubation Date/Time: 04/06/2023 10:20 AM  Performed by: Darryl Nestle, CRNAPre-anesthesia Checklist: Patient identified, Emergency Drugs available, Suction available and Patient being monitored Patient Re-evaluated:Patient Re-evaluated prior to induction Oxygen Delivery Method: Circle system utilized Preoxygenation: Pre-oxygenation with 100% oxygen Induction Type: IV induction Ventilation: Mask ventilation without difficulty Laryngoscope Size: Mac and 3 Grade View: Grade I Tube type: Oral Tube size: 7.0 mm Number of attempts: 1 Airway Equipment and Method: Stylet and Oral airway Placement Confirmation: ETT inserted through vocal cords under direct vision, positive ETCO2 and breath sounds checked- equal and bilateral Secured at: 23 cm Tube secured with: Tape Dental Injury: Teeth and Oropharynx as per pre-operative assessment

## 2023-04-06 NOTE — Interval H&P Note (Signed)
History and Physical Interval Note:  04/06/2023 9:35 AM  [name]  has presented today for surgery, with the diagnosis of RIGHT DISPLACED CLAVICLE FRACTURE.  The various methods of treatment have been discussed with the patient and family. After consideration of risks, benefits and other options for treatment, the patient has consented to  Procedure(s): RIGHT OPEN REDUCTION INTERNAL FIXATION (ORIF) CLAVICULAR FRACTURE (Right) as a surgical intervention.  The patient's history has been reviewed, patient examined, no change in status, stable for surgery.  I have reviewed the patient's chart and labs.  Questions were answered to the patient's satisfaction.     Huel Cote
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
## 2023-04-06 NOTE — Transfer of Care (Signed)
Immediate Anesthesia Transfer of Care Note  Patient: [name]  Procedure(s) Performed: RIGHT OPEN REDUCTION INTERNAL FIXATION (ORIF) CLAVICULAR FRACTURE (Right)  Patient Location: PACU  Anesthesia Type:General  Level of Consciousness: sedated  Airway & Oxygen Therapy: Patient Spontanous Breathing and Patient connected to face mask oxygen  Post-op Assessment: Report given to RN and Post -op Vital signs reviewed and stable  Post vital signs: Reviewed and stable  Last Vitals:  Vitals Value Taken Time  BP 103/74   Temp 98   Pulse 65 04/06/23 1215  Resp 14 04/06/23 1215  SpO2 94 % 04/06/23 1215  Vitals shown include unfiled device data.  Last Pain:  Vitals:   04/06/23 0949  TempSrc:   PainSc: 10-Worst pain ever         Complications: No notable events documented.

## 2023-04-06 NOTE — Anesthesia Postprocedure Evaluation (Signed)
Anesthesia Post Note  Patient: [name]  Procedure(s) Performed: RIGHT OPEN REDUCTION INTERNAL FIXATION (ORIF) CLAVICULAR FRACTURE (Right)     Patient location during evaluation: PACU Anesthesia Type: General Level of consciousness: awake and alert Pain management: pain level controlled Vital Signs Assessment: post-procedure vital signs reviewed and stable Respiratory status: spontaneous breathing, nonlabored ventilation and respiratory function stable Cardiovascular status: blood pressure returned to baseline and stable Postop Assessment: no apparent nausea or vomiting Anesthetic complications: no   No notable events documented.  Last Vitals:  Vitals:   04/06/23 1315 04/06/23 1327  BP: 112/69 105/69  Pulse: (!) 53 (!) 57  Resp: 17 14  Temp:  36.4 C  SpO2: 92% 93%    Last Pain:  Vitals:   04/06/23 1315  TempSrc:   PainSc: Asleep                 Lowella Curb

## 2023-04-09 ENCOUNTER — Encounter (HOSPITAL_COMMUNITY): Payer: Self-pay | Admitting: Orthopaedic Surgery

## 2023-04-19 ENCOUNTER — Ambulatory Visit (INDEPENDENT_AMBULATORY_CARE_PROVIDER_SITE_OTHER): Payer: 59

## 2023-04-19 ENCOUNTER — Ambulatory Visit (INDEPENDENT_AMBULATORY_CARE_PROVIDER_SITE_OTHER): Payer: 59 | Admitting: Student

## 2023-04-19 DIAGNOSIS — S42021A Displaced fracture of shaft of right clavicle, initial encounter for closed fracture: Secondary | ICD-10-CM | POA: Diagnosis not present

## 2023-04-19 DIAGNOSIS — S42021D Displaced fracture of shaft of right clavicle, subsequent encounter for fracture with routine healing: Secondary | ICD-10-CM | POA: Diagnosis not present
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
## 2023-04-23 ENCOUNTER — Emergency Department (HOSPITAL_COMMUNITY)
Admission: EM | Admit: 2023-04-23 | Discharge: 2023-04-23 | Discharge status: Against Medical Advice | Payer: 59 | Attending: Emergency Medicine | Admitting: Emergency Medicine

## 2023-04-23 ENCOUNTER — Encounter (HOSPITAL_COMMUNITY): Payer: Self-pay

## 2023-04-23 ENCOUNTER — Other Ambulatory Visit: Payer: Self-pay

## 2023-04-23 DIAGNOSIS — W57XXXA Bitten or stung by nonvenomous insect and other nonvenomous arthropods, initial encounter: Secondary | ICD-10-CM | POA: Diagnosis not present

## 2023-04-23 DIAGNOSIS — Z5321 Procedure and treatment not carried out due to patient leaving prior to being seen by health care provider: Secondary | ICD-10-CM | POA: Insufficient documentation

## 2023-04-23 DIAGNOSIS — R531 Weakness: Secondary | ICD-10-CM | POA: Insufficient documentation

## 2023-04-23 DIAGNOSIS — S50862A Insect bite (nonvenomous) of left forearm, initial encounter: Secondary | ICD-10-CM | POA: Insufficient documentation

## 2023-04-23 NOTE — ED Triage Notes (Signed)
Insect bite to LEFT forearm  Bite occurred Tuesday during the night Unsure what caused it  Red and swollen in triage Denies numbness and tingling in fingers  Pt also complains of weakness and feeling tired, Denies fevers

## 2023-04-24 DIAGNOSIS — L02414 Cutaneous abscess of left upper limb: Secondary | ICD-10-CM | POA: Diagnosis not present

## 2023-05-17 ENCOUNTER — Encounter (HOSPITAL_BASED_OUTPATIENT_CLINIC_OR_DEPARTMENT_OTHER): Payer: 59 | Admitting: Student

## 2023-05-17 DIAGNOSIS — S61216A Laceration without foreign body of right little finger without damage to nail, initial encounter: Secondary | ICD-10-CM | POA: Diagnosis not present

## 2023-05-17 DIAGNOSIS — W270XXA Contact with workbench tool, initial encounter: Secondary | ICD-10-CM | POA: Diagnosis not present

## 2023-05-17 DIAGNOSIS — I10 Essential (primary) hypertension: Secondary | ICD-10-CM | POA: Diagnosis not present

## 2023-05-17 DIAGNOSIS — F17210 Nicotine dependence, cigarettes, uncomplicated: Secondary | ICD-10-CM | POA: Diagnosis not present

## 2023-05-17 DIAGNOSIS — S62636A Displaced fracture of distal phalanx of right little finger, initial encounter for closed fracture: Secondary | ICD-10-CM | POA: Diagnosis not present

## 2023-05-17 DIAGNOSIS — Z23 Encounter for immunization: Secondary | ICD-10-CM | POA: Diagnosis not present

## 2023-05-26 ENCOUNTER — Ambulatory Visit (INDEPENDENT_AMBULATORY_CARE_PROVIDER_SITE_OTHER): Payer: 59

## 2023-05-26 ENCOUNTER — Ambulatory Visit (INDEPENDENT_AMBULATORY_CARE_PROVIDER_SITE_OTHER): Payer: 59 | Admitting: Student

## 2023-05-26 DIAGNOSIS — M79644 Pain in right finger(s): Secondary | ICD-10-CM

## 2023-05-26 DIAGNOSIS — S42021A Displaced fracture of shaft of right clavicle, initial encounter for closed fracture: Secondary | ICD-10-CM

## 2023-05-26 DIAGNOSIS — S42001D Fracture of unspecified part of right clavicle, subsequent encounter for fracture with routine healing: Secondary | ICD-10-CM | POA: Diagnosis not present

## 2023-05-26 DIAGNOSIS — S61216A Laceration without foreign body of right little finger without damage to nail, initial encounter: Secondary | ICD-10-CM

## 2023-05-26 DIAGNOSIS — M7989 Other specified soft tissue disorders: Secondary | ICD-10-CM | POA: Diagnosis not present
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
## 2024-02-17 ENCOUNTER — Other Ambulatory Visit: Payer: Self-pay

## 2024-02-17 ENCOUNTER — Encounter (HOSPITAL_COMMUNITY): Payer: Self-pay

## 2024-02-17 ENCOUNTER — Emergency Department (HOSPITAL_COMMUNITY)
Admission: EM | Admit: 2024-02-17 | Discharge: 2024-02-17 | Disposition: A | Discharge status: Home / Self Care | Attending: Emergency Medicine | Admitting: Emergency Medicine

## 2024-02-17 ENCOUNTER — Emergency Department (HOSPITAL_COMMUNITY)

## 2024-02-17 DIAGNOSIS — L089 Local infection of the skin and subcutaneous tissue, unspecified: Secondary | ICD-10-CM | POA: Diagnosis not present

## 2024-02-17 DIAGNOSIS — Z7982 Long term (current) use of aspirin: Secondary | ICD-10-CM | POA: Insufficient documentation

## 2024-02-17 DIAGNOSIS — M7989 Other specified soft tissue disorders: Secondary | ICD-10-CM | POA: Diagnosis present

## 2024-02-17 LAB — BASIC METABOLIC PANEL WITH GFR
Anion gap: 11 (ref 5–15)
BUN: 16 mg/dL (ref 6–20)
CO2: 26 mmol/L (ref 22–32)
Calcium: 9.2 mg/dL (ref 8.9–10.3)
Chloride: 97 mmol/L — ABNORMAL LOW (ref 98–111)
Creatinine, Ser: 0.8 mg/dL (ref 0.61–1.24)
GFR, Estimated: >60 mL/min (ref >60)
Glucose, Bld: 96 mg/dL (ref 70–99)
Potassium: 3.8 mmol/L (ref 3.5–5.1)
Sodium: 134 mmol/L — ABNORMAL LOW (ref 135–145)

## 2024-02-17 LAB — CBC WITH DIFFERENTIAL/PLATELET
Abs Immature Granulocytes: 0.02 10*3/uL (ref 0.00–0.07)
Basophils Absolute: 0 10*3/uL (ref 0.0–0.1)
Basophils Relative: 1 %
Eosinophils Absolute: 0.1 10*3/uL (ref 0.0–0.5)
Eosinophils Relative: 2 %
HCT: 43.1 % (ref 39.0–52.0)
Hemoglobin: 14.7 g/dL (ref 13.0–17.0)
Immature Granulocytes: 0 %
Lymphocytes Relative: 25 %
Lymphs Abs: 1.5 10*3/uL (ref 0.7–4.0)
MCH: 30.4 pg (ref 26.0–34.0)
MCHC: 34.1 g/dL (ref 30.0–36.0)
MCV: 89.2 fL (ref 80.0–100.0)
Monocytes Absolute: 0.7 10*3/uL (ref 0.1–1.0)
Monocytes Relative: 11 %
Neutro Abs: 3.7 10*3/uL (ref 1.7–7.7)
Neutrophils Relative %: 61 %
Platelets: 170 10*3/uL (ref 150–400)
RBC: 4.83 MIL/uL (ref 4.22–5.81)
RDW: 12.6 % (ref 11.5–15.5)
WBC: 6 10*3/uL (ref 4.0–10.5)
nRBC: 0 % (ref 0.0–0.2)

## 2024-02-17 LAB — C-REACTIVE PROTEIN: CRP: 1.2 mg/dL — ABNORMAL HIGH (ref <1.0)

## 2024-02-17 LAB — SEDIMENTATION RATE: Sed Rate: 5 mm/h (ref 0–15)

## 2024-02-17 MED ORDER — SULFAMETHOXAZOLE-TRIMETHOPRIM 800-160 MG PO TABS: 1.0000 | ORAL_TABLET | Freq: Two times a day (BID) | ORAL | 0 refills | Status: AC

## 2024-02-17 NOTE — ED Provider Notes (Signed)
 [place] EMERGENCY DEPARTMENT AT [facility] Provider Note   CSN: [id number] Arrival date & time: 02/17/24  1702     History  Chief Complaint  Patient presents with   finger infection    [name] is a 48 y.o. male.  48 year old male who presents to the emergency department with a right middle finger PIP pain and swelling.  Patient reports that several months ago he thinks he broke off a drill bit into his right finger.  Since then has developed some pain and swelling.  2 months ago went to urgent care and had the area drained and was placed on doxycycline but has had persistent pain and swelling since then.  The pain may be slightly worse over the past week.  No fevers.  No redness of the finger.  Says that it is stuck in partial flexion.       Home Medications Prior to Admission medications   Medication Sig Start Date End Date Taking? Authorizing Provider  sulfamethoxazole-trimethoprim (BACTRIM DS) 800-160 MG tablet Take 1 tablet by mouth 2 (two) times daily for 7 days. 02/17/24 02/24/24 Yes Ninetta Basket, MD  Aromatic Inhalants (VAPOR INHALER IN) Inhale 1 puff into the lungs daily as needed (congestion).    [provider]  aspirin  EC 325 MG tablet Take 1 tablet (325 mg total) by mouth daily. 04/06/23   Wilhelmenia Harada, MD  oxyCODONE  (ROXICODONE ) 5 MG immediate release tablet Take 1 tablet (5 mg total) by mouth every 4 (four) hours as needed for severe pain or breakthrough pain. 04/06/23   Wilhelmenia Harada, MD      Allergies    Patient has no known allergies.    Review of Systems   Review of Systems  Physical Exam Updated Vital Signs BP 118/88 (BP Location: Left Arm)   Pulse 80   Temp 98 F (36.7 C) (Oral)   Resp 17   Ht 5\' 7"  (1.702 m)   Wt 65.8 kg   SpO2 98%   BMI 22.72 kg/m  Physical Exam Musculoskeletal:     Comments: Right middle finger PIP with significant amount of swelling.  No fluctuance palpated.  No drainage.  No  significant erythema or warmth of it.  Range of motion is limited due to swelling and pain.  Remainder of the hand is normal-appearing.  Flexor tendon of the right middle finger without any tenderness to palpation and he reports the pain is mostly on the dorsal aspect of his knuckle.   Right hand:      ED Results / Procedures / Treatments   Labs (all labs ordered are listed, but only abnormal results are displayed) Labs Reviewed  BASIC METABOLIC PANEL WITH GFR - Abnormal; Notable for the following components:      Result Value   Sodium 134 (*)    Chloride 97 (*)    All other components within normal limits  CBC WITH DIFFERENTIAL/PLATELET  SEDIMENTATION RATE  C-REACTIVE PROTEIN    EKG None  Radiology No results found.  Procedures Procedures   EMERGENCY DEPARTMENT US  SOFT TISSUE INTERPRETATION "Study: Limited Soft Tissue Ultrasound"  INDICATIONS: Soft tissue infection Multiple views of the body part were obtained in real-time with a multi-frequency linear probe  PERFORMED BY: Myself IMAGES ARCHIVED?: No SIDE:Right  BODY PART:hand INTERPRETATION:  No abcess noted    Medications Ordered in ED Medications - No data to display  ED Course/ Medical Decision Making/ A&P Clinical Course as of 02/17/24 1859  Thu  Feb 17, 2024  1610 Discussed with Dr. Delmar Ferrara from hand.  Recommends follow-up in clinic tomorrow.  No acute inventions required. [RP]    Clinical Course User Index [RP] Ninetta Basket, MD                                 Medical Decision Making Amount and/or Complexity of Data Reviewed Labs: ordered. Radiology: ordered.  Risk Prescription drug management.   49 year old male with a history of right middle finger PIP injury who presents to the emergency department with pain and swelling  Initial Ddx:  Abscess, infection, traumatic arthrotomy, osteomyelitis, cellulitis  MDM/Course:  Patient presents emergency department with right middle finger  PIP pain and swelling.  This is after a injury where he had a drill bit break off into the joint several months ago.  Has had it drained before and reports that it is swollen and painful but has been this way for approximately a month and a half.  No signs of flexor tenosynovitis at this time.  Did perform an abscess and there is no drainable fluid collection currently.  His x-ray does show a retained metallic foreign body in his proximal phalanx.  No evidence of osteo-.  ESR and CRP pending at the time of discharge.  Discussed with orthopedics (hand surgery) and they will see the patient tomorrow in clinic.  Will send him home with Bactrim in case of infection in the meantime.    This patient presents to the ED for concern of complaints listed in HPI, this involves an extensive number of treatment options, and is a complaint that carries with it a high risk of complications and morbidity. Disposition including potential need for admission considered.   Dispo: DC Home. Return precautions discussed including, but not limited to, those listed in the AVS. Allowed pt time to ask questions which were answered fully prior to dc.  Additional history obtained from spouse Records reviewed ED Visit Notes The following labs were independently interpreted: CBC and show no acute abnormality I independently reviewed the following imaging with scope of interpretation limited to determining acute life threatening conditions related to emergency care: Extremity x-ray(s) and agree with the radiologist interpretation with the following exceptions: none I have reviewed the patients home medications and made adjustments as needed Consults: hand surgery  Portions of this note were generated with Dragon dictation software. Dictation errors may occur despite best attempts at proofreading.     Final Clinical Impression(s) / ED Diagnoses Final diagnoses:  Finger infection    Rx / DC Orders ED Discharge Orders           Ordered    sulfamethoxazole-trimethoprim (BACTRIM DS) 800-160 MG tablet  2 times daily        02/17/24 1818              Ninetta Basket, MD 02/17/24 2022

## 2024-02-17 NOTE — ED Triage Notes (Signed)
 Pt arrived via POV from [facility] for further evaluation of possible cellulitis in his right 3rd digit. Pt reports apprx 2.5 months ago a drill bit broke and punctured the middle knuckle in his 3rd right digit. Pt reports previous antibiotics have not helped.

## 2024-02-17 NOTE — Discharge Instructions (Signed)
 You were seen for your finger infection in the emergency department.   At home, please take the bactrim you were prescribed for the infection and tylenol  and ibuprofen  for pain.    Check your MyChart online for the results of any tests that had not resulted by the time you left the emergency department.   Follow-up with the hand surgery team tomorrow about your infection.   Return immediately to the emergency department if you experience any of the following: worsening pain, or any other concerning symptoms.    Thank you for visiting our Emergency Department. It was a pleasure taking care of you today.
